# Patient Record
Sex: Female | Born: 2000 | Race: White | Hispanic: No | Marital: Single | Smoking: Never smoker
Health system: Southern US, Community
[De-identification: ages and names within clinical notes are randomized; demographics above are authoritative.]

## PROBLEM LIST (undated history)

## (undated) ENCOUNTER — Inpatient Hospital Stay (HOSPITAL_COMMUNITY): Payer: Self-pay

## (undated) DIAGNOSIS — Z309 Encounter for contraceptive management, unspecified: Secondary | ICD-10-CM

## (undated) DIAGNOSIS — Z789 Other specified health status: Secondary | ICD-10-CM

## (undated) DIAGNOSIS — Z91013 Allergy to seafood: Secondary | ICD-10-CM

## (undated) HISTORY — DX: Encounter for contraceptive management, unspecified: Z30.9

## (undated) HISTORY — DX: Allergy to seafood: Z91.013

## (undated) HISTORY — PX: BUNIONECTOMY: SHX129

---
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
## 2022-12-08 DIAGNOSIS — Z9889 Other specified postprocedural states: Secondary | ICD-10-CM | POA: Insufficient documentation

## 2022-12-08 DIAGNOSIS — G8929 Other chronic pain: Secondary | ICD-10-CM | POA: Insufficient documentation

## 2022-12-08 DIAGNOSIS — R202 Paresthesia of skin: Secondary | ICD-10-CM | POA: Insufficient documentation

## 2022-12-08 DIAGNOSIS — Q6689 Other  specified congenital deformities of feet: Secondary | ICD-10-CM | POA: Insufficient documentation

## 2022-12-08 DIAGNOSIS — M2012 Hallux valgus (acquired), left foot: Secondary | ICD-10-CM | POA: Insufficient documentation

## 2023-11-28 ENCOUNTER — Encounter (HOSPITAL_BASED_OUTPATIENT_CLINIC_OR_DEPARTMENT_OTHER): Payer: Self-pay | Admitting: Emergency Medicine

## 2023-11-28 ENCOUNTER — Ambulatory Visit (HOSPITAL_BASED_OUTPATIENT_CLINIC_OR_DEPARTMENT_OTHER)
Admission: EM | Admit: 2023-11-28 | Discharge: 2023-11-28 | Disposition: A | Discharge status: Home / Self Care | Payer: Medicaid Other

## 2023-11-28 DIAGNOSIS — Z3A16 16 weeks gestation of pregnancy: Secondary | ICD-10-CM

## 2023-11-28 DIAGNOSIS — R109 Unspecified abdominal pain: Secondary | ICD-10-CM | POA: Diagnosis not present

## 2023-11-28 DIAGNOSIS — J069 Acute upper respiratory infection, unspecified: Secondary | ICD-10-CM | POA: Diagnosis not present

## 2023-11-28 NOTE — ED Triage Notes (Signed)
 Cough, headache, back of neck hurting x 5 days getting worse. stomach pain x 2 days, pt is [redacted] weeks pregnant. Pt was seen at another urgent care  2 days ago and was tested for covid and flu both were negative.  Pt has taken delsym

## 2023-11-28 NOTE — Discharge Instructions (Addendum)
 As we discussed, I think that you have a viral illness that is causing your cough.  I am hopeful that your abdominal pain is related to muscle strain from the coughing, however, given how tender you are and the fact that you are pregnant I do recommend you go to be evaluated.  Please call your OB/GYN to determine the most appropriate place for evaluation.  If you have difficulty getting in touch with them you can go to [facility] maternal assessment unit.  Below is their address.  [address] entrance [place], [place] [address] [phone]

## 2023-11-28 NOTE — ED Provider Notes (Signed)
 [facility]    CSN: [id number] Arrival date & time: 11/28/23  1920      History   Chief Complaint No chief complaint on file.   HPI Kim Tate is a 23 y.o. female.   Patient presents today with a 5 to 6-day history of URI symptoms including cough, headache, body aches, fever, abdominal pain.  She has had some nausea but attributes this to her pregnancy as she is currently [redacted] weeks pregnant.  She did contact her OB/GYN to be tested for though she denies any sore throat or odynophagia.  She was seen within the past 48 hours at a different urgent care where she tested negative for COVID and flu.  She has been taking Delsym and Tylenol  without improvement.  Her concern today is the abdominal pain.  This is present all the time and not associated with coughing.  It is currently rated 4/5 on a 0-10 pain scale but worsens intermittently without identifiable trigger.  Denies any vomiting, melena, hematochezia.  Denies previous abdominal surgery and still has her gallbladder and appendix.  She denies any vaginal discharge or abnormal bleeding.  She did discuss this with her OB/GYN who thought it was related to typical uterine expansion related to the pregnancy but she is concerned because it has been persisting and worsening.  She denies any urinary symptoms.    Past Medical History:  Diagnosis Date   Abdominal pain    Constipation     Patient Active Problem List   Diagnosis Date Noted   GE reflux 04/20/2011   Chronic constipation    Generalized abdominal pain     Past Surgical History:  Procedure Laterality Date   BUNIONECTOMY Left     OB History     Gravida  1   Para      Term      Preterm      AB      Living         SAB      IAB      Ectopic      Multiple      Live Births               Home Medications    Prior to Admission medications   Medication Sig Start Date End Date Taking? Authorizing Provider  ondansetron  (ZOFRAN ) 8 MG tablet  Take by mouth. 10/24/23  Yes [provider]  ibuprofen  (ADVIL ,MOTRIN ) 600 MG tablet Take 1 tab PO Q6H x 1-2 days then Q6H prn pain 09/05/17   Eilleen Colander, NP  loratadine (CLARITIN) 10 MG tablet Take 10 mg by mouth daily.   02/22/11   [provider]  omeprazole  (PRILOSEC) 20 MG capsule Take 1 capsule (20 mg total) by mouth daily. 05/24/11 05/23/12  Gretta Fairy DEL, MD  polyethylene glycol powder (GLYCOLAX /MIRALAX ) powder Take 17 g by mouth daily. 04/20/11   Gretta Fairy DEL, MD  sennosides (SENOKOT) 8.8 MG/5ML syrup Take 5 mLs by mouth at bedtime.   03/09/11   [provider]    Family History History reviewed. No pertinent family history.  Social History Social History   Tobacco Use   Smoking status: Never     Allergies   Patient has no known allergies.   Review of Systems Review of Systems  Constitutional:  Positive for activity change and fever. Negative for appetite change and fatigue.  HENT:  Negative for congestion.   Respiratory:  Positive for cough. Negative for shortness of  breath.   Cardiovascular:  Negative for chest pain.  Gastrointestinal:  Positive for abdominal pain and nausea. Negative for diarrhea and vomiting.  Musculoskeletal:  Positive for arthralgias and myalgias.  Neurological:  Positive for headaches. Negative for dizziness and light-headedness.     Physical Exam Triage Vital Signs ED Triage Vitals  Encounter Vitals Group     BP 11/28/23 1930 114/77     Systolic BP Percentile --      Diastolic BP Percentile --      Pulse Rate 11/28/23 1930 (!) 102     Resp 11/28/23 1930 18     Temp 11/28/23 1930 98.2 F (36.8 C)     Temp Source 11/28/23 1930 Oral     SpO2 11/28/23 1930 96 %     Weight --      Height --      Head Circumference --      Peak Flow --      Pain Score 11/28/23 1928 4     Pain Loc --      Pain Education --      Exclude from Growth Chart --    No data found.  Updated Vital Signs BP 114/77 (BP Location:  Right Arm)   Pulse (!) 102   Temp 98.2 F (36.8 C) (Oral)   Resp 18   SpO2 96%   Visual Acuity Right Eye Distance:   Left Eye Distance:   Bilateral Distance:    Right Eye Near:   Left Eye Near:    Bilateral Near:     Physical Exam Vitals reviewed.  Constitutional:      General: She is awake. She is not in acute distress.    Appearance: Normal appearance. She is well-developed. She is not ill-appearing.     Comments: Very pleasant female appears stated age in no acute distress sitting comfortably in exam room  HENT:     Head: Normocephalic and atraumatic.     Right Ear: Tympanic membrane, ear canal and external ear normal. Tympanic membrane is not erythematous or bulging.     Left Ear: Ear canal and external ear normal. A middle ear effusion is present. Tympanic membrane is not erythematous or bulging.     Nose:     Right Sinus: No maxillary sinus tenderness or frontal sinus tenderness.     Left Sinus: No maxillary sinus tenderness or frontal sinus tenderness.     Mouth/Throat:     Pharynx: Uvula midline. No oropharyngeal exudate or posterior oropharyngeal erythema.  Cardiovascular:     Rate and Rhythm: Regular rhythm. Tachycardia present.     Heart sounds: Normal heart sounds, S1 normal and S2 normal. No murmur heard. Pulmonary:     Effort: Pulmonary effort is normal.     Breath sounds: Normal breath sounds. No wheezing, rhonchi or rales.     Comments: Reactive cough with deep breathing Abdominal:     General: Bowel sounds are normal.     Palpations: Abdomen is soft.     Tenderness: There is abdominal tenderness in the right upper quadrant and right lower quadrant. There is guarding. There is no right CVA tenderness, left CVA tenderness or rebound.     Comments: Significant tenderness palpation over right abdomen with associated guarding.  Psychiatric:        Behavior: Behavior is cooperative.      UC Treatments / Results  Labs (all labs ordered are listed, but only  abnormal results are displayed) Labs Reviewed - No data  to display  EKG   Radiology No results found.  Procedures Procedures (including critical care time)  Medications Ordered in UC Medications - No data to display  Initial Impression / Assessment and Plan / UC Course  I have reviewed the triage vital signs and the nursing notes.  Pertinent labs & imaging results that were available during my care of the patient were reviewed by me and considered in my medical decision making (see chart for details).     Patient is mildly tachycardic but otherwise well-appearing, afebrile, nontoxic.  She did have significant tenderness on exam and we discussed that I recommend she go to the emergency room for further evaluation and management.  I am hopeful that this is related to muscle strain from coughing.  Given she is pregnant she can go to the [facility] and was given their contact information but also recommended that she contact her OB/GYN to determine if they have someone else they prefer her to go to be evaluated where they are on-call.  We discussed that she likely has a virus and there is no evidence of acute infection on physical exam that would warrant initiation of antibiotics.  Recommend conservative treatment measures including over-the-counter medications today for pregnancy that she was provided by her OB/GYN as well as a humidifier and nasal saline.  She is to rest and drink plenty of fluid.  Patient is agreeable and will go directly to [facility].  Her mother will transport her and she was stable at the time of discharge.  Final Clinical Impressions(s) / UC Diagnoses   Final diagnoses:  Right sided abdominal pain  [redacted] weeks gestation of pregnancy  Viral URI     Discharge Instructions      As we discussed, I think that you have a viral illness that is causing your cough.  I am hopeful that your abdominal pain is related to muscle strain from the coughing, however, given how tender you are  and the fact that you are pregnant I do recommend you go to be evaluated.  Please call your OB/GYN to determine the most appropriate place for evaluation.  If you have difficulty getting in touch with them you can go to [facility] maternal assessment unit.  Below is their address.  [address] [place], [place] [address] [phone]     ED Prescriptions   None    PDMP not reviewed this encounter.   Sherrell Rocky POUR, PA-C 11/28/23 1947

## 2024-01-09 ENCOUNTER — Inpatient Hospital Stay (HOSPITAL_COMMUNITY)
Admission: AD | Admit: 2024-01-09 | Discharge: 2024-01-10 | Disposition: A | Discharge status: Home / Self Care | Payer: Medicaid Other | Attending: Obstetrics & Gynecology | Admitting: Obstetrics & Gynecology

## 2024-01-09 ENCOUNTER — Other Ambulatory Visit: Payer: Self-pay

## 2024-01-09 ENCOUNTER — Encounter (HOSPITAL_COMMUNITY): Payer: Self-pay | Admitting: Obstetrics & Gynecology

## 2024-01-09 DIAGNOSIS — Z3A22 22 weeks gestation of pregnancy: Secondary | ICD-10-CM | POA: Diagnosis not present

## 2024-01-09 DIAGNOSIS — E739 Lactose intolerance, unspecified: Secondary | ICD-10-CM | POA: Diagnosis not present

## 2024-01-09 DIAGNOSIS — O99282 Endocrine, nutritional and metabolic diseases complicating pregnancy, second trimester: Secondary | ICD-10-CM | POA: Diagnosis not present

## 2024-01-09 DIAGNOSIS — R252 Cramp and spasm: Secondary | ICD-10-CM | POA: Diagnosis not present

## 2024-01-09 DIAGNOSIS — O99612 Diseases of the digestive system complicating pregnancy, second trimester: Secondary | ICD-10-CM | POA: Insufficient documentation

## 2024-01-09 DIAGNOSIS — G4486 Cervicogenic headache: Secondary | ICD-10-CM | POA: Insufficient documentation

## 2024-01-09 DIAGNOSIS — K5909 Other constipation: Secondary | ICD-10-CM | POA: Diagnosis not present

## 2024-01-09 DIAGNOSIS — O26892 Other specified pregnancy related conditions, second trimester: Secondary | ICD-10-CM | POA: Diagnosis present

## 2024-01-09 DIAGNOSIS — Z711 Person with feared health complaint in whom no diagnosis is made: Secondary | ICD-10-CM | POA: Diagnosis not present

## 2024-01-09 DIAGNOSIS — O99352 Diseases of the nervous system complicating pregnancy, second trimester: Secondary | ICD-10-CM | POA: Diagnosis not present

## 2024-01-09 MED ORDER — POLYETHYLENE GLYCOL 3350 17 G PO PACK: 17.0000 g | PACK | Freq: Every day | ORAL | Status: DC

## 2024-01-09 MED ORDER — POLYETHYLENE GLYCOL 3350 17 G PO PACK
17.0000 g | PACK | Freq: Once | ORAL | Status: AC
  Administered 2024-01-10: 17 g via ORAL
  Filled 2024-01-09: qty 1

## 2024-01-09 MED ORDER — ACETAMINOPHEN-CAFFEINE 500-65 MG PO TABS
2.0000 | ORAL_TABLET | Freq: Once | ORAL | Status: AC
  Administered 2024-01-09: 2 via ORAL
  Filled 2024-01-09: qty 2

## 2024-01-09 MED ORDER — MAGNESIUM GLUCONATE 500 MG PO TABS
500.0000 mg | ORAL_TABLET | Freq: Every day | ORAL | Status: DC
  Administered 2024-01-09: 500 mg via ORAL
  Filled 2024-01-09: qty 1

## 2024-01-09 NOTE — MAU Provider Note (Signed)
 Chief Complaint:  Dizziness, Abdominal Pain, and Headache  HPI  HPI: Kim Tate is a 23 y.o. G1P0 at 75w2dwho presents to maternity admissions reporting several concerns.  Frontal headache without photophobia, phonophobia or aura, history of similar. Tried one 1/2 tablet of tylenol yesterday without relief.   Lightheadedness intermittently for 2 weeks, with rapid position changes especially after using the bathroom. Drinking several Stanley's a day. Voiding clear and often. Eating 3 meals a day; reportedly well rounded.   Intermittent abdominal pain that is relieved with direct pressure, heat, and or position changes. Often following milk consumption. Childhood lactose intolerance reported.   Chronic constipation with small difficult stools every few days. History of hospital bowel clean, with miralax. Last stool today, small difficult. No bleeding or mucus.   She reports good fetal movement, denies LOF, vaginal bleeding, vaginal itching/burning, urinary symptoms, h/a, dizziness, n/v, diarrhea, constipation or fever/chills.  She denies headache, visual changes or RUQ abdominal pain.   Past Medical History: Past Medical History:  Diagnosis Date   Abdominal pain    Constipation     Past obstetric history: OB History  Gravida Para Term Preterm AB Living  1       SAB IAB Ectopic Multiple Live Births          # Outcome Date GA Lbr Len/2nd Weight Sex Type Anes PTL Lv  1 Current             Past Surgical History: Past Surgical History:  Procedure Laterality Date   BUNIONECTOMY Left     Family History: No family history on file.  Social History: Social History   Tobacco Use   Smoking status: Never  Vaping Use   Vaping status: Never Used  Substance Use Topics   Alcohol use: Not Currently   Drug use: Not Currently    Allergies: No Known Allergies  Meds:  Medications Prior to Admission  Medication Sig Dispense Refill Last Dose/Taking   acetaminophen (TYLENOL) 325  MG tablet Take 325 mg by mouth every 6 (six) hours as needed.   01/08/2024 Evening   ondansetron (ZOFRAN) 8 MG tablet Take by mouth.   Past Month   ibuprofen (ADVIL,MOTRIN) 600 MG tablet Take 1 tab PO Q6H x 1-2 days then Q6H prn pain 30 tablet 0    loratadine (CLARITIN) 10 MG tablet Take 10 mg by mouth daily.        omeprazole (PRILOSEC) 20 MG capsule Take 1 capsule (20 mg total) by mouth daily. 30 capsule 6    polyethylene glycol powder (GLYCOLAX/MIRALAX) powder Take 17 g by mouth daily. 255 g 11    sennosides (SENOKOT) 8.8 MG/5ML syrup Take 5 mLs by mouth at bedtime.         I have reviewed patient's Past Medical Hx, Surgical Hx, Family Hx, Social Hx, medications and allergies.   ROS:  Review of Systems Other systems negative  Physical Exam  Patient Vitals for the past 24 hrs:  BP Temp Temp src Pulse Resp SpO2 Height Weight  01/09/24 2250 116/67 -- -- 89 15 99 % -- --  01/09/24 2232 109/62 97.9 F (36.6 C) Oral 75 14 -- 5\' 3"  (1.6 m) 76 kg   Constitutional: Well-developed, well-nourished female in no acute distress.  Cardiovascular: normal rate and rhythm Respiratory: normal effort, clear to auscultation bilaterally GI: Abd soft, non-tender, gravid appropriate for gestational age.   No rebound or guarding. MS: Extremities nontender, no edema, normal ROM Neurologic: Alert and oriented x 4.  GU: Neg CVAT.    FHT: 141 on bedside US   Labs: No results found for this or any previous visit (from the past 24 hours).    Imaging:  No results found.  MAU Course/MDM: I have reviewed the triage vital signs and the nursing notes.   Pertinent labs & imaging results that were available during my care of the patient were reviewed by me and considered in my medical decision making (see chart for details).      I have reviewed her medical records including past results, notes and treatments.   Treatments in MAU included limited bedside US, Excedrin tension, magnesium gluconate, miralax.     Assessment: 1. [redacted] weeks gestation of pregnancy   2. Muscle cramps   3. Cervicogenic headache   4. Lactose intolerance   5. Physically well but worried   6. Chronic constipation    Suspect normal complaints of pregnancy, chronic constipation and lactose intolerance.   Constipation - recommend fiber rich diet - miralax titrated to one soft easy stool every 24-48 hours  Lactose intolerance - Avoidance of milk - Lactaid OTC PRN  Muscle cramps - adequate hydration, magnesium gluconate 500 MG at bedtime  Lightheadedness - suspect Orthostasis vs Vasovagal episodes - adequate hydration - slow transitions  Headache - trial Excedrin tension    Plan: Discharge home Return precautions provided  Allergies as of 01/10/2024   No Known Allergies      Medication List     STOP taking these medications    ibuprofen 600 MG tablet Commonly known as: ADVIL       TAKE these medications    acetaminophen 325 MG tablet Commonly known as: TYLENOL Take 325 mg by mouth every 6 (six) hours as needed.   loratadine 10 MG tablet Commonly known as: CLARITIN Take 10 mg by mouth daily.   magnesium gluconate 500 MG tablet Commonly known as: MAGONATE Take 1 tablet (500 mg total) by mouth daily.   omeprazole 20 MG capsule Commonly known as: PRILOSEC Take 1 capsule (20 mg total) by mouth daily.   ondansetron 8 MG tablet Commonly known as: ZOFRAN Take by mouth.   polyethylene glycol powder 17 GM/SCOOP powder Commonly known as: GLYCOLAX/MIRALAX Take 17 g by mouth daily.   sennosides 8.8 MG/5ML syrup Commonly known as: SENOKOT Take 5 mLs by mouth at bedtime.        Follow up in Office for prenatal visits and recheck  Pt stable at time of discharge.  Wyn Forster, MD FMOB Fellow, Faculty practice [facility], Center for [facility]  01/10/2024 12:20 AM

## 2024-01-09 NOTE — MAU Note (Signed)
 Pt says she feels  dizzy x2 weeks . [facility]- [facility] in [place] - she told them last Wed 01-04-2024- on phone - Told her it's bc her blood sugar is dropping- bc she doesn't eat enough- She called them again today at 0930 and 842pm- told them - upper abd - right side . They said go to ER . She was at friend's house in [place]- so came here . Upper right pain- 5/10. Left lower abd pain - 5/10- started at 10pm H/A- started 3 days ago. Took reg Tyl  1 tab- last night  She fell Sat bc she was so dizzy

## 2024-01-10 DIAGNOSIS — Z711 Person with feared health complaint in whom no diagnosis is made: Secondary | ICD-10-CM

## 2024-01-10 DIAGNOSIS — Z3A22 22 weeks gestation of pregnancy: Secondary | ICD-10-CM

## 2024-01-10 MED ORDER — MAGNESIUM GLUCONATE 500 MG PO TABS: 500.0000 mg | ORAL_TABLET | Freq: Every day | ORAL | Status: DC

## 2024-03-05 ENCOUNTER — Ambulatory Visit (HOSPITAL_BASED_OUTPATIENT_CLINIC_OR_DEPARTMENT_OTHER): Admitting: Student

## 2024-03-05 NOTE — Progress Notes (Deleted)
 New Patient Office Visit  Subjective    Patient ID: Kim Tate, female    DOB: 15-May-2001  Age: 23 y.o. MRN: [id number]  CC: No chief complaint on file.   Discussed the use of AI scribe software for clinical note transcription with the patient, who gave verbal consent to proceed.  History of Present Illness            HPI Kim Tate presents to establish care. Prior PCP was *** at ***. Last physical was ***. she notes that she requires refills of ***.  Preg- [facility]  Screenings:  Colon Cancer: *** Lung Cancer: *** Breast Cancer: *** Diabetes: *** Ophthalmology*** Foot*** HLD: ***  The ASCVD Risk score (Arnett DK, et al., 2019) failed to calculate for the following reasons:   The 2019 ASCVD risk score is only valid for ages 63 to 82 Hep B Vax: ***  Acute Problems: ***  Outpatient Encounter Medications as of 03/05/2024  Medication Sig   acetaminophen (TYLENOL) 325 MG tablet Take 325 mg by mouth every 6 (six) hours as needed.   loratadine (CLARITIN) 10 MG tablet Take 10 mg by mouth daily.     magnesium gluconate (MAGONATE) 500 MG tablet Take 1 tablet (500 mg total) by mouth daily.   omeprazole (PRILOSEC) 20 MG capsule Take 1 capsule (20 mg total) by mouth daily.   ondansetron (ZOFRAN) 8 MG tablet Take by mouth.   polyethylene glycol powder (GLYCOLAX/MIRALAX) powder Take 17 g by mouth daily.   sennosides (SENOKOT) 8.8 MG/5ML syrup Take 5 mLs by mouth at bedtime.     No facility-administered encounter medications on file as of 03/05/2024.    Past Medical History:  Diagnosis Date   Abdominal pain    Constipation     Past Surgical History:  Procedure Laterality Date   BUNIONECTOMY Left     No family history on file.  Social History   Socioeconomic History   Marital status: Single    Spouse name: Not on file   Number of children: Not on file   Years of education: Not on file   Highest education level: Not on file  Occupational History   Not  on file  Tobacco Use   Smoking status: Never   Smokeless tobacco: Not on file  Vaping Use   Vaping status: Never Used  Substance and Sexual Activity   Alcohol use: Not Currently   Drug use: Not Currently   Sexual activity: Yes  Other Topics Concern   Not on file  Social History Narrative   Not on file   Social Drivers of Health   Financial Resource Strain: Not on file  Food Insecurity: No Food Insecurity (01/09/2024)   Hunger Vital Sign    Worried About Running Out of Food in the Last Year: Never true    Ran Out of Food in the Last Year: Never true  Transportation Needs: No Transportation Needs (01/09/2024)   PRAPARE - Administrator, Civil Service (Medical): No    Lack of Transportation (Non-Medical): No  Physical Activity: Not on file  Stress: Not on file  Social Connections: Not on file  Intimate Partner Violence: Not At Risk (01/09/2024)   Humiliation, Afraid, Rape, and Kick questionnaire    Fear of Current or Ex-Partner: No    Emotionally Abused: No    Physically Abused: No    Sexually Abused: No    ROS  Per HPI      Objective  There were no vitals taken for this visit.  Physical Exam  {Labs (Optional):23779}    Assessment & Plan:   There are no diagnoses linked to this encounter.  Assessment and Plan              No follow-ups on file.   Fredda Clarida T Eames Dibiasio, PA-C

## 2024-04-10 ENCOUNTER — Encounter (HOSPITAL_COMMUNITY): Payer: Self-pay | Admitting: Obstetrics and Gynecology

## 2024-04-10 ENCOUNTER — Inpatient Hospital Stay (HOSPITAL_COMMUNITY)
Admission: AD | Admit: 2024-04-10 | Discharge: 2024-04-11 | Disposition: A | Discharge status: Home / Self Care | Attending: Obstetrics and Gynecology | Admitting: Obstetrics and Gynecology

## 2024-04-10 DIAGNOSIS — O26893 Other specified pregnancy related conditions, third trimester: Secondary | ICD-10-CM | POA: Insufficient documentation

## 2024-04-10 DIAGNOSIS — Z3A35 35 weeks gestation of pregnancy: Secondary | ICD-10-CM | POA: Insufficient documentation

## 2024-04-10 DIAGNOSIS — M545 Low back pain, unspecified: Secondary | ICD-10-CM | POA: Insufficient documentation

## 2024-04-10 DIAGNOSIS — R109 Unspecified abdominal pain: Secondary | ICD-10-CM | POA: Insufficient documentation

## 2024-04-10 DIAGNOSIS — O4703 False labor before 37 completed weeks of gestation, third trimester: Secondary | ICD-10-CM | POA: Insufficient documentation

## 2024-04-10 HISTORY — DX: Other specified health status: Z78.9

## 2024-04-10 NOTE — MAU Note (Signed)
 Kim Tate is a 23 y.o. at [redacted]w[redacted]d here in MAU reporting lower back pain that radiates around to her lower abdomen since 1630. Pain is like period cramps. Denies LOF or VB. Reports feeling the baby move today but not as much as usual.   LMP: n/a Onset of complaint: 1630 Pain score: 5 Vitals:   04/10/24 2324 04/10/24 2325  BP:  120/80  Pulse: 86   Resp: 17   Temp: 98.1 F (36.7 C)   SpO2: 100%      FHT: 150  Lab orders placed from triage: u/a

## 2024-04-10 NOTE — MAU Provider Note (Signed)
 Chief Complaint:  Abdominal Pain and Back Pain   Event Date/Time   First Provider Initiated Contact with Patient 04/10/24 2351     HPI: Kim Tate is a 23 y.o. G1P0 at 21w3dwho presents to maternity admissions reporting low back pain which wraps around lower abdomen.  Comes and goes and lasts a few seconds at a time.  [facility]ed [facility] provider at [facility] and they told her to use an Ice Pack. . She reports good fetal movement, denies LOF, vaginal bleeding, urinary symptoms, n/v, diarrhea, constipation or fever/chills.  .  Abdominal Pain This is a new problem. The current episode started today. The problem occurs intermittently. The quality of the pain is described as cramping. Pertinent negatives include no constipation, diarrhea or dysuria. Past treatments include nothing.  Back Pain The problem occurs intermittently. The pain is present in the lumbar spine. The quality of the pain is described as aching and cramping. Associated symptoms include abdominal pain. Pertinent negatives include no dysuria.   RN Note Shakeerah Gradel is a 23 y.o. at [redacted]w[redacted]d here in MAU reporting lower back pain that radiates around to her lower abdomen since 1630. Pain is like period cramps. Denies LOF or VB. Reports feeling the baby move today but not as much as usual.                         Onset of complaint: 1630 Pain score: 5  Past Medical History: Past Medical History:  Diagnosis Date   Abdominal pain    Constipation    Medical history non-contributory     Past obstetric history: OB History  Gravida Para Term Preterm AB Living  1       SAB IAB Ectopic Multiple Live Births          # Outcome Date GA Lbr Len/2nd Weight Sex Type Anes PTL Lv  1 Current             Past Surgical History: Past Surgical History:  Procedure Laterality Date   BUNIONECTOMY Left     Family History: History reviewed. No pertinent family history.  Social History: Social History   Tobacco Use   Smoking status: Never   Vaping Use   Vaping status: Never Used  Substance Use Topics   Alcohol use: Not Currently   Drug use: Not Currently    Allergies: No Known Allergies  Meds:  Medications Prior to Admission  Medication Sig Dispense Refill Last Dose/Taking   Prenatal Vit-Fe Fumarate-FA (MULTIVITAMIN-PRENATAL) 27-0.8 MG TABS tablet Take 1 tablet by mouth daily at 12 noon.   04/10/2024   acetaminophen  (TYLENOL ) 325 MG tablet Take 325 mg by mouth every 6 (six) hours as needed.      loratadine (CLARITIN) 10 MG tablet Take 10 mg by mouth daily.        magnesium  gluconate (MAGONATE) 500 MG tablet Take 1 tablet (500 mg total) by mouth daily.      omeprazole  (PRILOSEC) 20 MG capsule Take 1 capsule (20 mg total) by mouth daily. 30 capsule 6    ondansetron (ZOFRAN) 8 MG tablet Take by mouth.      polyethylene glycol powder (GLYCOLAX /MIRALAX ) powder Take 17 g by mouth daily. 255 g 11    sennosides (SENOKOT) 8.8 MG/5ML syrup Take 5 mLs by mouth at bedtime.         I have reviewed patient's Past Medical Hx, Surgical Hx, Family Hx, Social Hx, medications and allergies.   ROS:  Review  of Systems  Gastrointestinal:  Positive for abdominal pain. Negative for constipation and diarrhea.  Genitourinary:  Negative for dysuria.  Musculoskeletal:  Positive for back pain.   Other systems negative  Physical Exam  Patient Vitals for the past 24 hrs:  BP Temp Pulse Resp SpO2 Height Weight  04/10/24 2350 127/73 -- 83 -- -- -- --  04/10/24 2325 120/80 -- -- -- -- -- --  04/10/24 2324 -- 98.1 F (36.7 C) 86 17 100 % 5\' 3"  (1.6 m) 86.2 kg   Constitutional: Well-developed, well-nourished female in no acute distress.  Cardiovascular: normal rate  Respiratory: normal effort GI: Abd soft, non-tender, gravid appropriate for gestational age.   No rebound or guarding. MS: Extremities nontender, no edema, normal ROM Neurologic: Alert and oriented x 4.   PELVIC EXAM:   Dilation: Closed Effacement (%): 50 Station: [place],  -3 Exam by:: Holmes Lusher, CNM  FHT:  Baseline 135 , moderate variability, accelerations present, no decelerations Contractions:  Irregular     Labs: No results found for this or any previous visit (from the past 24 hours).    Imaging:  No results found.  MAU Course/MDM: I have reviewed the triage vital signs and the nursing notes.   Pertinent labs & imaging results that were available during my care of the patient were reviewed by me and considered in my medical decision making (see chart for details).      I have reviewed her medical records including past results, notes and treatments.   NST reviewed Treatments in MAU included Procardia series x 3 doses and Flexeril for back pain  Contractions diminished and patient felt better. .    Assessment: Single IUP at [redacted]w[redacted]d Threatened preterm labor  Plan: Discharge home Preterm Labor precautions and fetal kick counts Follow up in Office for prenatal visits and recheck Encouraged to return if she develops worsening of symptoms, increase in pain, fever, or other concerning symptoms.   Pt stable at time of discharge.  Holmes Lusher CNM, MSN Certified Nurse-Midwife 04/10/2024 11:51 PM

## 2024-04-11 DIAGNOSIS — M545 Low back pain, unspecified: Secondary | ICD-10-CM | POA: Diagnosis not present

## 2024-04-11 DIAGNOSIS — Z3A35 35 weeks gestation of pregnancy: Secondary | ICD-10-CM

## 2024-04-11 DIAGNOSIS — O26893 Other specified pregnancy related conditions, third trimester: Secondary | ICD-10-CM | POA: Diagnosis present

## 2024-04-11 DIAGNOSIS — O4703 False labor before 37 completed weeks of gestation, third trimester: Secondary | ICD-10-CM

## 2024-04-11 DIAGNOSIS — R109 Unspecified abdominal pain: Secondary | ICD-10-CM | POA: Diagnosis not present

## 2024-04-11 MED ORDER — NIFEDIPINE 10 MG PO CAPS
10.0000 mg | ORAL_CAPSULE | ORAL | Status: DC | PRN
  Administered 2024-04-11 (×3): 10 mg via ORAL
  Filled 2024-04-11 (×3): qty 1

## 2024-04-11 MED ORDER — CYCLOBENZAPRINE HCL 5 MG PO TABS
5.0000 mg | ORAL_TABLET | Freq: Once | ORAL | Status: AC
  Administered 2024-04-11: 5 mg via ORAL
  Filled 2024-04-11: qty 1

## 2024-04-12 ENCOUNTER — Encounter (HOSPITAL_BASED_OUTPATIENT_CLINIC_OR_DEPARTMENT_OTHER): Payer: Self-pay | Admitting: Family Medicine

## 2024-04-12 ENCOUNTER — Ambulatory Visit (HOSPITAL_BASED_OUTPATIENT_CLINIC_OR_DEPARTMENT_OTHER): Admitting: Family Medicine

## 2024-04-12 VITALS — BP 116/75 | HR 97 | Temp 98.0°F | Resp 16 | Ht 63.0 in | Wt 185.5 lb

## 2024-04-12 DIAGNOSIS — Z91013 Allergy to seafood: Secondary | ICD-10-CM | POA: Insufficient documentation

## 2024-04-12 DIAGNOSIS — K219 Gastro-esophageal reflux disease without esophagitis: Secondary | ICD-10-CM

## 2024-04-12 NOTE — Assessment & Plan Note (Addendum)
 Doing well.  F/u with OB as directed and commended on healthy habits.  Vaccines reviewed and she declines any shots today.  Labs per OB.

## 2024-04-12 NOTE — Progress Notes (Signed)
 Established Patient Office Visit  Subjective   Patient ID: Ceriah Tate, female    DOB: [birth year]  Age: 23 y.o. MRN: [id number]  Chief Complaint  Patient presents with   Establish Care    Here to establish care.    Annual Exam    Here for annual exam.     F/u as above.  SHE IS PREGNANT.  Sees OB in [place].  Overall doing well with few concerns.    Past Medical History:  Diagnosis Date   Shellfish allergy     Outpatient Encounter Medications as of 04/12/2024  Medication Sig   acetaminophen  (TYLENOL ) 325 MG tablet Take 325 mg by mouth every 6 (six) hours as needed.   ondansetron (ZOFRAN) 8 MG tablet Take 8 mg by mouth every 8 (eight) hours as needed.   Prenatal Vit-Fe Fumarate-FA (MULTIVITAMIN-PRENATAL) 27-0.8 MG TABS tablet Take 1 tablet by mouth daily at 12 noon.   [DISCONTINUED] loratadine (CLARITIN) 10 MG tablet Take 10 mg by mouth daily.   (Patient not taking: Reported on 04/12/2024)   [DISCONTINUED] magnesium  gluconate (MAGONATE) 500 MG tablet Take 1 tablet (500 mg total) by mouth daily. (Patient not taking: Reported on 04/12/2024)   [DISCONTINUED] omeprazole  (PRILOSEC) 20 MG capsule Take 1 capsule (20 mg total) by mouth daily. (Patient not taking: Reported on 04/12/2024)   [DISCONTINUED] polyethylene glycol powder (GLYCOLAX /MIRALAX ) powder Take 17 g by mouth daily. (Patient not taking: Reported on 04/12/2024)   [DISCONTINUED] sennosides (SENOKOT) 8.8 MG/5ML syrup Take 5 mLs by mouth at bedtime.   (Patient not taking: Reported on 04/12/2024)   No facility-administered encounter medications on file as of 04/12/2024.    Social History   Tobacco Use   Smoking status: Never    Passive exposure: Never   Smokeless tobacco: Never  Vaping Use   Vaping status: Never Used  Substance Use Topics   Alcohol use: Not Currently   Drug use: Never   Family History  Problem Relation Age of Onset   Brain cancer Father    Kidney disease Sister    Neurologic Disorder Sister         Review of Systems  Constitutional:  Negative for diaphoresis, fever, malaise/fatigue and weight loss.  Respiratory:  Negative for cough, shortness of breath and wheezing.   Cardiovascular:  Negative for chest pain, palpitations, orthopnea, claudication, leg swelling and PND.      Objective:     BP 116/75   Pulse 97   Temp 98 F (36.7 C) (Oral)   Resp 16   Ht 5\' 3"  (1.6 m)   Wt 185 lb 8 oz (84.1 kg)   SpO2 97%   BMI 32.86 kg/m    Physical Exam Constitutional:      General: She is not in acute distress.    Appearance: Normal appearance.     Comments: Gravid  HENT:     Head: Normocephalic.     Nose: Nose normal.     Mouth/Throat:     Pharynx: Oropharynx is clear.  Neck:     Vascular: No carotid bruit.     Comments: Thyroid normal to palpation Cardiovascular:     Rate and Rhythm: Normal rate and regular rhythm.     Pulses: Normal pulses.     Heart sounds: Normal heart sounds.  Pulmonary:     Effort: Pulmonary effort is normal.     Breath sounds: Normal breath sounds.  Abdominal:     General: Bowel sounds are normal.  Palpations: Abdomen is soft.  Musculoskeletal:     Cervical back: Neck supple. No tenderness.     Right lower leg: No edema.     Left lower leg: No edema.  Skin:    Comments: No concerning nevi noted  Neurological:     Mental Status: She is alert.     Comments: Nonfocal exam  Psychiatric:        Mood and Affect: Mood normal.      No results found for any visits on 04/12/24.    The ASCVD Risk score (Arnett DK, et al., 2019) failed to calculate for the following reasons:   The 2019 ASCVD risk score is only valid for ages 8 to 47    Assessment & Plan:  Shellfish allergy  Gastroesophageal reflux disease, unspecified whether esophagitis present Assessment & Plan: Doing well.  F/u with OB as directed and commended on healthy habits.     Return if symptoms worsen or fail to improve.    REDDING Reece Cane., MD

## 2024-05-10 DIAGNOSIS — O1490 Unspecified pre-eclampsia, unspecified trimester: Secondary | ICD-10-CM | POA: Insufficient documentation

## 2024-05-10 DIAGNOSIS — D62 Acute posthemorrhagic anemia: Secondary | ICD-10-CM | POA: Insufficient documentation

## 2024-06-03 ENCOUNTER — Ambulatory Visit (HOSPITAL_BASED_OUTPATIENT_CLINIC_OR_DEPARTMENT_OTHER)
Admission: RE | Admit: 2024-06-03 | Discharge: 2024-06-03 | Disposition: A | Discharge status: Other Acute Inpatient Hospital | Source: Ambulatory Visit

## 2024-06-03 ENCOUNTER — Encounter (HOSPITAL_BASED_OUTPATIENT_CLINIC_OR_DEPARTMENT_OTHER): Payer: Self-pay

## 2024-06-03 VITALS — BP 120/81 | HR 129 | Temp 100.4°F | Resp 18

## 2024-06-03 DIAGNOSIS — R112 Nausea with vomiting, unspecified: Secondary | ICD-10-CM

## 2024-06-03 DIAGNOSIS — E860 Dehydration: Secondary | ICD-10-CM | POA: Diagnosis not present

## 2024-06-03 DIAGNOSIS — O8622 Infection of bladder following delivery: Secondary | ICD-10-CM | POA: Diagnosis not present

## 2024-06-03 DIAGNOSIS — R509 Fever, unspecified: Secondary | ICD-10-CM | POA: Diagnosis not present

## 2024-06-03 MED ORDER — SODIUM CHLORIDE 0.9 % IV SOLN: Freq: Once | INTRAVENOUS | Status: DC

## 2024-06-03 MED ORDER — ONDANSETRON 4 MG PO TBDP
4.0000 mg | ORAL_TABLET | Freq: Once | ORAL | Status: AC
  Administered 2024-06-03: 4 mg via ORAL

## 2024-06-03 NOTE — ED Notes (Signed)
 Patient is being discharged from the Urgent Care and sent to the Emergency Department via EMS . Per Lauraine Settler NP, patient is in need of higher level of care due to abnormal vital signs and possible septic. Patient is aware and verbalizes understanding of plan of care.  Vitals:   06/03/24 1415 06/03/24 1439  BP: 114/71 120/81  Pulse: (!) 112 (!) 129  Resp: 18 18  Temp: 99.3 F (37.4 C) (!) 100.4 F (38 C)  SpO2: 97%

## 2024-06-03 NOTE — ED Triage Notes (Signed)
 Pt is 3 weeks postpartum  she had a C-section due to prolapsed cord on Friday she didn't have any urgency to urinate she contact her OB they did a urine and was told this morning she has a UTI and was prescribed keflex she did take one dose but she threw it right back up. Pt has been vomiting, nausea, headache, dizziness since yesterday.

## 2024-06-03 NOTE — ED Provider Notes (Addendum)
 [facility]    CSN: [id number] Arrival date & time: 06/03/24  1403      History   Chief Complaint Chief Complaint  Patient presents with   Abdominal Pain   Nausea   Urinary Tract Infection    HPI Kim Tate is a 23 y.o. female.   25 23 year old female who had a C-section for prolapsed fetal cord on 05/07/2024.  She developed urinary retention and difficulty urinating on Friday, 06/01/2024.  She spoke to her obstetrician and went to [facility] on 06/02/2024 and gave a urine specimen.  She got a call this morning from her obstetrician and was told she had an acute UTI and was started on antibiotics.  She picked up the antibiotics and promptly vomited them back up.  She is weak, shaky, having chills, having significant diffuse abdominal pain, fever, tachycardia.   Abdominal Pain Associated symptoms: chills, fever, nausea and vomiting   Associated symptoms: no chest pain, no constipation, no cough, no diarrhea, no dysuria, no hematuria, no shortness of breath and no sore throat   Urinary Tract Infection Associated symptoms: abdominal pain, fever, nausea and vomiting     Past Medical History:  Diagnosis Date   Shellfish allergy     Patient Active Problem List   Diagnosis Date Noted   Shellfish allergy 04/12/2024   GERD (gastroesophageal reflux disease) 04/20/2011   Chronic constipation    Generalized abdominal pain     Past Surgical History:  Procedure Laterality Date   BUNIONECTOMY Left     OB History     Gravida  1   Para  1   Term  1   Preterm      AB      Living  1      SAB      IAB      Ectopic      Multiple      Live Births  1            Home Medications    Prior to Admission medications   Medication Sig Start Date End Date Taking? Authorizing Provider  cephALEXin (KEFLEX) 500 MG capsule Take 500 mg by mouth. 06/03/24 06/13/24 Yes [provider]  ferrous sulfate 325 (65 FE) MG tablet Take 325 mg by mouth.  05/10/24 08/08/24 Yes [provider]  acetaminophen  (TYLENOL ) 325 MG tablet Take 325 mg by mouth every 6 (six) hours as needed.    [provider]  ondansetron  (ZOFRAN ) 8 MG tablet Take 8 mg by mouth every 8 (eight) hours as needed. 10/24/23   [provider]  Prenatal Vit-Fe Fumarate-FA (MULTIVITAMIN-PRENATAL) 27-0.8 MG TABS tablet Take 1 tablet by mouth daily at 12 noon.    [provider]    Family History Family History  Problem Relation Age of Onset   Brain cancer Father    Kidney disease Sister    Neurologic Disorder Sister     Social History Social History   Tobacco Use   Smoking status: Never    Passive exposure: Never   Smokeless tobacco: Never  Vaping Use   Vaping status: Never Used  Substance Use Topics   Alcohol use: Not Currently   Drug use: Never     Allergies   Shellfish allergy   Review of Systems Review of Systems  Constitutional:  Positive for chills, diaphoresis and fever.  HENT:  Negative for ear pain and sore throat.   Eyes:  Negative for pain and visual disturbance.  Respiratory:  Negative for cough and shortness of breath.   Cardiovascular:  Positive for palpitations. Negative for chest pain.  Gastrointestinal:  Positive for abdominal pain, nausea and vomiting. Negative for constipation and diarrhea.  Genitourinary:  Negative for dysuria and hematuria.  Musculoskeletal:  Negative for arthralgias and back pain.  Skin:  Negative for color change and rash.  Neurological:  Positive for tremors and weakness. Negative for seizures and syncope.  All other systems reviewed and are negative.    Physical Exam Triage Vital Signs ED Triage Vitals  Encounter Vitals Group     BP 06/03/24 1415 114/71     Girls Systolic BP Percentile --      Girls Diastolic BP Percentile --      Boys Systolic BP Percentile --      Boys Diastolic BP Percentile --      Pulse Rate 06/03/24 1415 (!) 112     Resp 06/03/24 1415 18     Temp  06/03/24 1415 99.3 F (37.4 C)     Temp Source 06/03/24 1415 Oral     SpO2 06/03/24 1415 97 %     Weight --      Height --      Head Circumference --      Peak Flow --      Pain Score 06/03/24 1412 4     Pain Loc --      Pain Education --      Exclude from Growth Chart --    No data found.  Updated Vital Signs BP 120/81 (BP Location: Right Arm)   Pulse (!) 129   Temp (!) 100.4 F (38 C) (Oral)   Resp 18   SpO2 97%   Breastfeeding Yes   Visual Acuity Right Eye Distance:   Left Eye Distance:   Bilateral Distance:    Right Eye Near:   Left Eye Near:    Bilateral Near:     Physical Exam Vitals and nursing note reviewed.  Constitutional:      General: She is in acute distress.     Appearance: She is well-developed. She is ill-appearing and diaphoretic. She is not toxic-appearing.  HENT:     Head: Normocephalic and atraumatic.     Right Ear: Hearing, tympanic membrane, ear canal and external ear normal.     Left Ear: Hearing, tympanic membrane, ear canal and external ear normal.     Nose: No congestion or rhinorrhea.     Right Sinus: No maxillary sinus tenderness or frontal sinus tenderness.     Left Sinus: No maxillary sinus tenderness or frontal sinus tenderness.     Mouth/Throat:     Lips: Pink.     Mouth: Mucous membranes are dry.     Pharynx: Uvula midline. No oropharyngeal exudate or posterior oropharyngeal erythema.     Tonsils: No tonsillar exudate.  Eyes:     Conjunctiva/sclera: Conjunctivae normal.     Pupils: Pupils are equal, round, and reactive to light.  Cardiovascular:     Rate and Rhythm: Regular rhythm. Tachycardia present.     Heart sounds: S1 normal and S2 normal. No murmur heard. Pulmonary:     Effort: Pulmonary effort is normal. No respiratory distress.     Breath sounds: Normal breath sounds. No decreased breath sounds, wheezing, rhonchi or rales.  Abdominal:     General: Bowel sounds are normal.     Palpations: Abdomen is soft.      Tenderness: There is abdominal tenderness (Moderate  to severe abdominal pain.  Abdominal pain is diffuse.) in the right upper quadrant, right lower quadrant, epigastric area, suprapubic area, left upper quadrant and left lower quadrant. There is right CVA tenderness, left CVA tenderness and guarding. There is no rebound. Negative signs include Murphy's sign, Rovsing's sign and McBurney's sign.  Musculoskeletal:        General: No swelling.     Cervical back: Neck supple.  Lymphadenopathy:     Head:     Right side of head: No submental, submandibular, tonsillar, preauricular or posterior auricular adenopathy.     Left side of head: No submental, submandibular, tonsillar, preauricular or posterior auricular adenopathy.     Cervical: No cervical adenopathy.     Right cervical: No superficial cervical adenopathy.    Left cervical: No superficial cervical adenopathy.  Skin:    General: Skin is warm.     Capillary Refill: Capillary refill takes less than 2 seconds.     Findings: No rash.  Neurological:     Mental Status: She is alert and oriented to person, place, and time.  Psychiatric:        Mood and Affect: Mood is anxious.      UC Treatments / Results  Labs (all labs ordered are listed, but only abnormal results are displayed) Labs Reviewed - No data to display  EKG   Radiology No results found.  Procedures Procedures (including critical care time)  Medications Ordered in UC Medications  ondansetron  (ZOFRAN -ODT) disintegrating tablet 4 mg (4 mg Oral Given 06/03/24 1503)    Initial Impression / Assessment and Plan / UC Course  I have reviewed the triage vital signs and the nursing notes.  Pertinent labs & imaging results that were available during my care of the patient were reviewed by me and considered in my medical decision making (see chart for details).  Plan of Care: Fever, nausea and vomiting, dehydration, postpartum acute cystitis with possible sepsis.  Unable to  get an IV after 3 attempts.  EMS called for transport to a hospital for further evaluation for possible sepsis.  They will take her to [facility].  Given ondansetron  4 mg ODT at 3:00 PM.  EMS onsite and transporting.  I reviewed the plan of care with the patient and/or the patient's guardian.  The patient and/or guardian had time to ask questions and acknowledged that the questions were answered.  I provided instruction on symptoms or reasons to return here or to go to an ER, if symptoms/condition did not improve, worsened or if new symptoms occurred.   I spent over 30 minutes on patient care, including face to face time and care planning/patient management.  Additional time needed for closer monitoring, attempts at IV insertion and handoff with EMS. Final Clinical Impressions(s) / UC Diagnoses   Final diagnoses:  Fever, unspecified  Nausea and vomiting, unspecified vomiting type  Dehydration  Postpartum acute cystitis     Discharge Instructions      Fever, nausea and vomiting, dehydration and postpartum acute cystitis with possible sepsis: EMS called to transport patient to local hospital for further evaluation of possible postpartum sepsis.  Attempted IV start x 3 and were unable to get an IV.     ED Prescriptions   None    PDMP not reviewed this encounter.   Ival Domino, FNP 06/03/24 1504    Ival Domino, FNP 06/03/24 1511    Ival Domino, FNP 06/03/24 [phone]

## 2024-06-03 NOTE — Discharge Instructions (Addendum)
 Fever, nausea and vomiting, dehydration and postpartum acute cystitis with possible sepsis: EMS called to transport patient to local hospital for further evaluation of possible postpartum sepsis.  Attempted IV start x 3 and were unable to get an IV.

## 2024-06-06 ENCOUNTER — Telehealth (HOSPITAL_BASED_OUTPATIENT_CLINIC_OR_DEPARTMENT_OTHER): Payer: Self-pay | Admitting: Family Medicine

## 2024-06-06 NOTE — Telephone Encounter (Signed)
 Copied from CRM [id number]. 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 Callback number is [phone].

## 2024-06-11 ENCOUNTER — Encounter (HOSPITAL_BASED_OUTPATIENT_CLINIC_OR_DEPARTMENT_OTHER): Payer: Self-pay | Admitting: Family Medicine

## 2024-06-11 ENCOUNTER — Ambulatory Visit (HOSPITAL_BASED_OUTPATIENT_CLINIC_OR_DEPARTMENT_OTHER): Admitting: Family Medicine

## 2024-06-11 VITALS — BP 109/74 | HR 99 | Temp 97.7°F | Resp 16 | Ht 63.39 in | Wt 165.9 lb

## 2024-06-11 DIAGNOSIS — Z0000 Encounter for general adult medical examination without abnormal findings: Secondary | ICD-10-CM

## 2024-06-11 NOTE — Assessment & Plan Note (Addendum)
 Doing fine.  Certainly fit for nursing school.  Forms completed today.  Vaccine records reviewed.

## 2024-06-11 NOTE — Progress Notes (Signed)
 Established Patient Office Visit  Subjective   Patient ID: Kim Tate, female    DOB: 2001-11-05  Age: 23 y.o. MRN: [id number]  Chief Complaint  Patient presents with   Need physical form filled out     Need physical form filled out for school    F/u as above.  New to my practice.  She is a new mom and doing fine.  SHE IS BREASTFEEDING.  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    Past Medical History:  Diagnosis Date   Contraception management    Sees OB/GYN   Shellfish allergy    Not severe    Outpatient Encounter Medications as of 06/11/2024  Medication Sig   acetaminophen  (TYLENOL ) 325 MG tablet Take 325 mg by mouth every 6 (six) hours as needed.   cephALEXin (KEFLEX) 500 MG capsule Take 500 mg by mouth.   ibuprofen  (ADVIL ) 600 MG tablet Take 600 mg by mouth every 6 (six) hours.   ibuprofen  (ADVIL ) 800 MG tablet Take 800 mg by mouth 3 (three) times daily.   ondansetron  (ZOFRAN ) 8 MG tablet Take 8 mg by mouth every 8 (eight) hours as needed.   ondansetron  (ZOFRAN -ODT) 4 MG disintegrating tablet SMARTSIG:4 Milligram(s) By Mouth Every 8 Hours PRN   ferrous sulfate 325 (65 FE) MG tablet Take 325 mg by mouth. (Patient not taking: Reported on 06/11/2024)   Prenatal Vit-Fe Fumarate-FA (MULTIVITAMIN-PRENATAL) 27-0.8 MG TABS tablet Take 1 tablet by mouth daily at 12 noon. (Patient not taking: Reported on 06/11/2024)   No facility-administered encounter medications on file as of 06/11/2024.    Social History   Tobacco Use   Smoking status: Never    Passive exposure: Never   Smokeless tobacco: Never  Vaping Use   Vaping status: Never Used  Substance Use Topics   Alcohol use: Not Currently   Drug use: Never      Review of Systems  Constitutional: Negative.   HENT: Negative.    Eyes: Negative.   Respiratory: Negative.    Cardiovascular: Negative.   Gastrointestinal: Negative.   Genitourinary: Negative.   Musculoskeletal: Negative.   Skin: Negative.    Neurological: Negative.   Endo/Heme/Allergies: Negative.   Psychiatric/Behavioral: Negative.        Objective:     BP 109/74 (BP Location: Right Arm, Patient Position: Standing, Cuff Size: Normal)   Pulse 99   Temp 97.7 F (36.5 C) (Oral)   Resp 16   Ht 5' 3.39 (1.61 m)   Wt 165 lb 14.4 oz (75.3 kg)   SpO2 99%   Breastfeeding Yes   BMI 29.03 kg/m    Physical Exam Constitutional:      General: She is not in acute distress.    Appearance: Normal appearance. She is not ill-appearing.  HENT:     Right Ear: Tympanic membrane normal.     Left Ear: Tympanic membrane normal.     Nose: Nose normal.     Mouth/Throat:     Pharynx: Oropharynx is clear.  Eyes:     Extraocular Movements: Extraocular movements intact.     Conjunctiva/sclera: Conjunctivae normal.     Pupils: Pupils are equal, round, and reactive to light.  Neck:     Thyroid: No thyroid mass, thyromegaly or thyroid tenderness.     Vascular: No carotid bruit.  Cardiovascular:     Rate and Rhythm: Normal rate and regular rhythm.     Pulses: Normal pulses.     Heart sounds: Normal heart sounds.  Pulmonary:     Breath sounds: Normal breath sounds.  Abdominal:     General: Bowel sounds are normal. There is no distension.     Palpations: Abdomen is soft. There is no mass.     Tenderness: There is no abdominal tenderness.     Hernia: No hernia is present.  Musculoskeletal:        General: No tenderness. Normal range of motion.     Cervical back: Normal range of motion and neck supple. No tenderness.     Right lower leg: No edema.     Left lower leg: No edema.  Lymphadenopathy:     Cervical: No cervical adenopathy.  Skin:    General: Skin is warm and dry.     Coloration: Skin is not jaundiced.     Findings: No lesion.  Neurological:     General: No focal deficit present.     Mental Status: She is alert.  Psychiatric:        Mood and Affect: Mood normal.      No results found for any visits on  06/11/24.    The ASCVD Risk score (Arnett DK, et al., 2019) failed to calculate for the following reasons:   The 2019 ASCVD risk score is only valid for ages 31 to 45    Assessment & Plan:  Well woman exam (no gynecological exam) Assessment & Plan: Doing fine.  Certainly fit for nursing school.  Forms completed today.  Vaccine records reviewed.     Return if symptoms worsen or fail to improve.    REDDING PONCE NORLEEN FALCON., MD

## 2024-06-19 ENCOUNTER — Encounter (HOSPITAL_BASED_OUTPATIENT_CLINIC_OR_DEPARTMENT_OTHER): Admitting: Family Medicine

## 2024-09-03 ENCOUNTER — Ambulatory Visit: Payer: Self-pay

## 2024-09-03 NOTE — Telephone Encounter (Signed)
 FYI Only or Action Required?: FYI only for provider.  Patient was last seen in primary care on 06/11/2024 by Dottie Norleen PHEBE PONCE, MD.  Called Nurse Triage reporting Back Pain.  Symptoms began several days ago.  Interventions attempted: Ice/heat application.  Symptoms are: unchanged.  Triage Disposition: See HCP Within 4 Hours (Or PCP Triage)  Patient/caregiver understands and will follow disposition?: Yes  Copied from CRM [id number]. Topic: Clinical - Red Word Triage >> Sep 03, 2024  4:27 PM Jasmin G wrote: Red Word that prompted transfer to Nurse Triage: Pt states that she has been experiencing lower back pain for the last 3-4 days that has been worsening at the same spot where she was experiencing pain when she had a kidney infection. Reason for Disposition  [1] MILD-MODERATE pain AND [2] constant AND [3] present > 2 hours  Answer Assessment - Initial Assessment Questions 1. ONSET: When did the pain begin? (e.g., minutes, hours, days)     3 days ago 2. LOCATION: Where does it hurt? (upper, mid or lower back)     Lower left side back pain and lower abdomen 3. SEVERITY: How bad is the pain?  (e.g., Scale 1-10; mild, moderate, or severe)     4/10; getting worse 4. PATTERN: Is the pain constant? (e.g., yes, no; constant, intermittent)      constant 5. RADIATION: Does the pain shoot into your legs or somewhere else?     Back and goes to lower abd 6. CAUSE:  What do you think is causing the back pain?      dehydration  8. MEDICINES: What have you taken so far for the pain? (e.g., nothing, acetaminophen , NSAIDS)     Heating pad, tylenol  9. NEUROLOGIC SYMPTOMS: Do you have any weakness, numbness, or problems with bowel/bladder control?     no 10. OTHER SYMPTOMS: Do you have any other symptoms? (e.g., fever, abdomen pain, burning with urination, blood in urine)       Denies fever, chills, nausea/ vomiting, burn urination, blood urine 11. PREGNANCY: Is there any  chance you are pregnant? When was your last menstrual period?       08/15/24  Answer Assessment - Initial Assessment Questions No available appts today. Advised UC/ED now. Patient reports going to UC.  1. ONSET: When did the pain begin? (e.g., minutes, hours, days)     3 days ago 2. LOCATION: Where does it hurt? (upper, mid or lower back)     Lower left side back pain and lower abdomen 3. SEVERITY: How bad is the pain?  (e.g., Scale 1-10; mild, moderate, or severe)     4/10; getting worse 4. PATTERN: Is the pain constant? (e.g., yes, no; constant, intermittent)      constant 5. RADIATION: Does the pain shoot into your legs or somewhere else?     Back and goes to lower abd 6. CAUSE:  What do you think is causing the back pain?      dehydration  8. MEDICINES: What have you taken so far for the pain? (e.g., nothing, acetaminophen , NSAIDS)     Heating pad, tylenol  9. NEUROLOGIC SYMPTOMS: Do you have any weakness, numbness, or problems with bowel/bladder control?     no 10. OTHER SYMPTOMS: Do you have any other symptoms? (e.g., fever, abdomen pain, burning with urination, blood in urine)       Denies fever, chills, nausea/ vomiting, burn urination, blood urine 11. PREGNANCY: Is there any chance you are pregnant? When was your last  menstrual period?       08/15/24  Protocols used: Back Pain-A-AH, Abdominal Pain - Female-A-AH

## 2024-09-04 ENCOUNTER — Encounter (HOSPITAL_BASED_OUTPATIENT_CLINIC_OR_DEPARTMENT_OTHER): Payer: Self-pay

## 2024-09-06 ENCOUNTER — Ambulatory Visit (HOSPITAL_BASED_OUTPATIENT_CLINIC_OR_DEPARTMENT_OTHER): Admitting: Family Medicine

## 2024-09-14 ENCOUNTER — Other Ambulatory Visit (HOSPITAL_BASED_OUTPATIENT_CLINIC_OR_DEPARTMENT_OTHER): Payer: Self-pay

## 2024-09-14 MED ORDER — FLUZONE 0.5 ML IM SUSY
0.5000 mL | PREFILLED_SYRINGE | Freq: Once | INTRAMUSCULAR | 0 refills | Status: AC
  Filled 2024-09-14: qty 0.5, 1d supply, fill #0
